# Patient Record
Sex: Male | Born: 1980 | Hispanic: Yes | Marital: Single
Health system: Southern US, Community
[De-identification: ages and names within clinical notes are randomized; demographics above are authoritative.]

---

## 2004-08-17 ENCOUNTER — Ambulatory Visit (HOSPITAL_BASED_OUTPATIENT_CLINIC_OR_DEPARTMENT_OTHER): Admission: RE | Admit: 2004-08-17 | Discharge: 2004-08-17 | Payer: Self-pay | Admitting: Orthopedic Surgery

## 2008-03-19 ENCOUNTER — Emergency Department (HOSPITAL_COMMUNITY): Admission: EM | Admit: 2008-03-19 | Discharge: 2008-03-19 | Payer: Self-pay | Admitting: Emergency Medicine

## 2008-04-03 ENCOUNTER — Emergency Department (HOSPITAL_COMMUNITY): Admission: EM | Admit: 2008-04-03 | Discharge: 2008-04-03 | Payer: Self-pay | Admitting: Emergency Medicine

## 2008-11-29 IMAGING — CR DG CHEST 2V
2 series · 2 of 2 positions shown · non-contrast
Comparison: None.

CLINICAL DATA: Cough, upper respiratory changes, fever]

CHEST - 2 VIEW

[w chest pa]
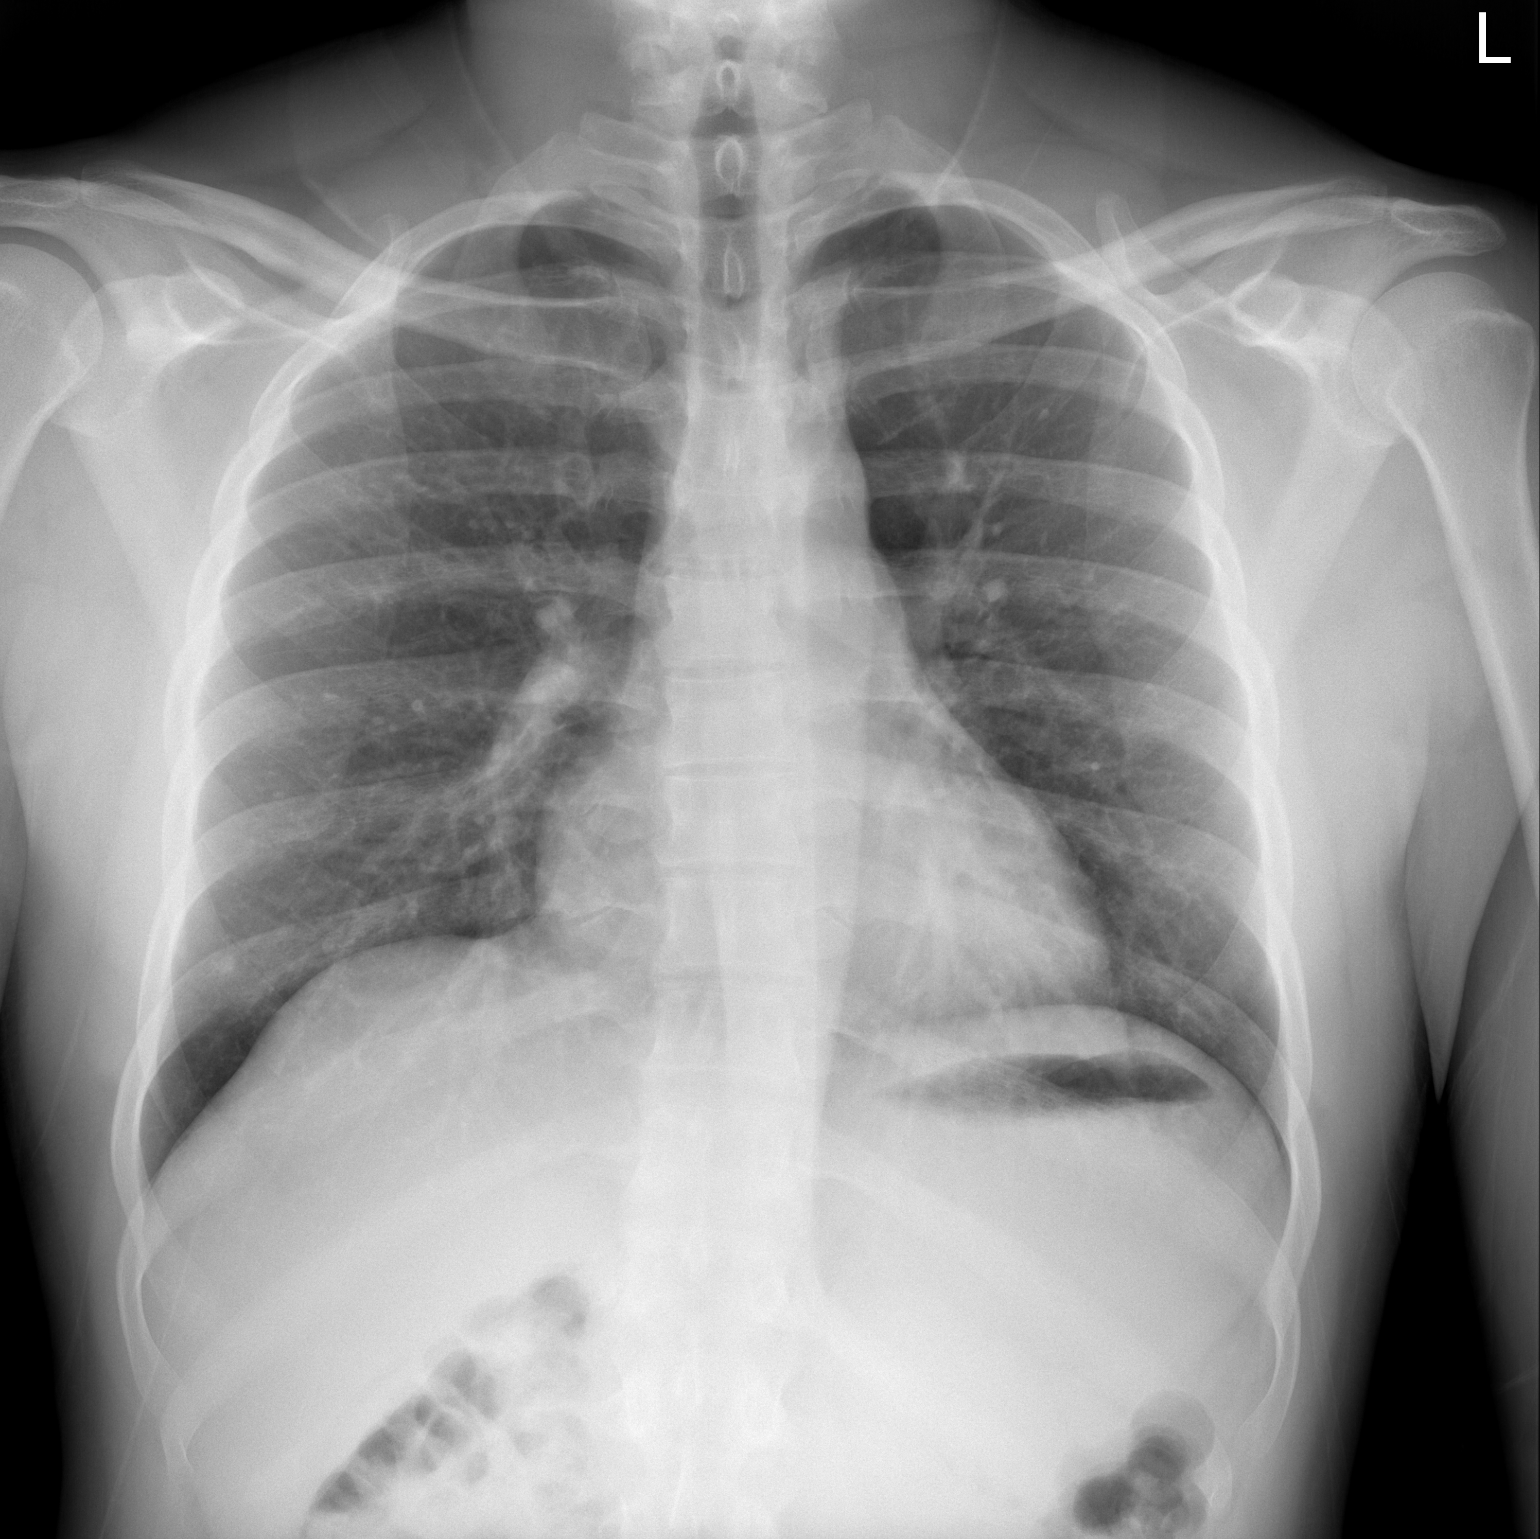

[w chest lat]
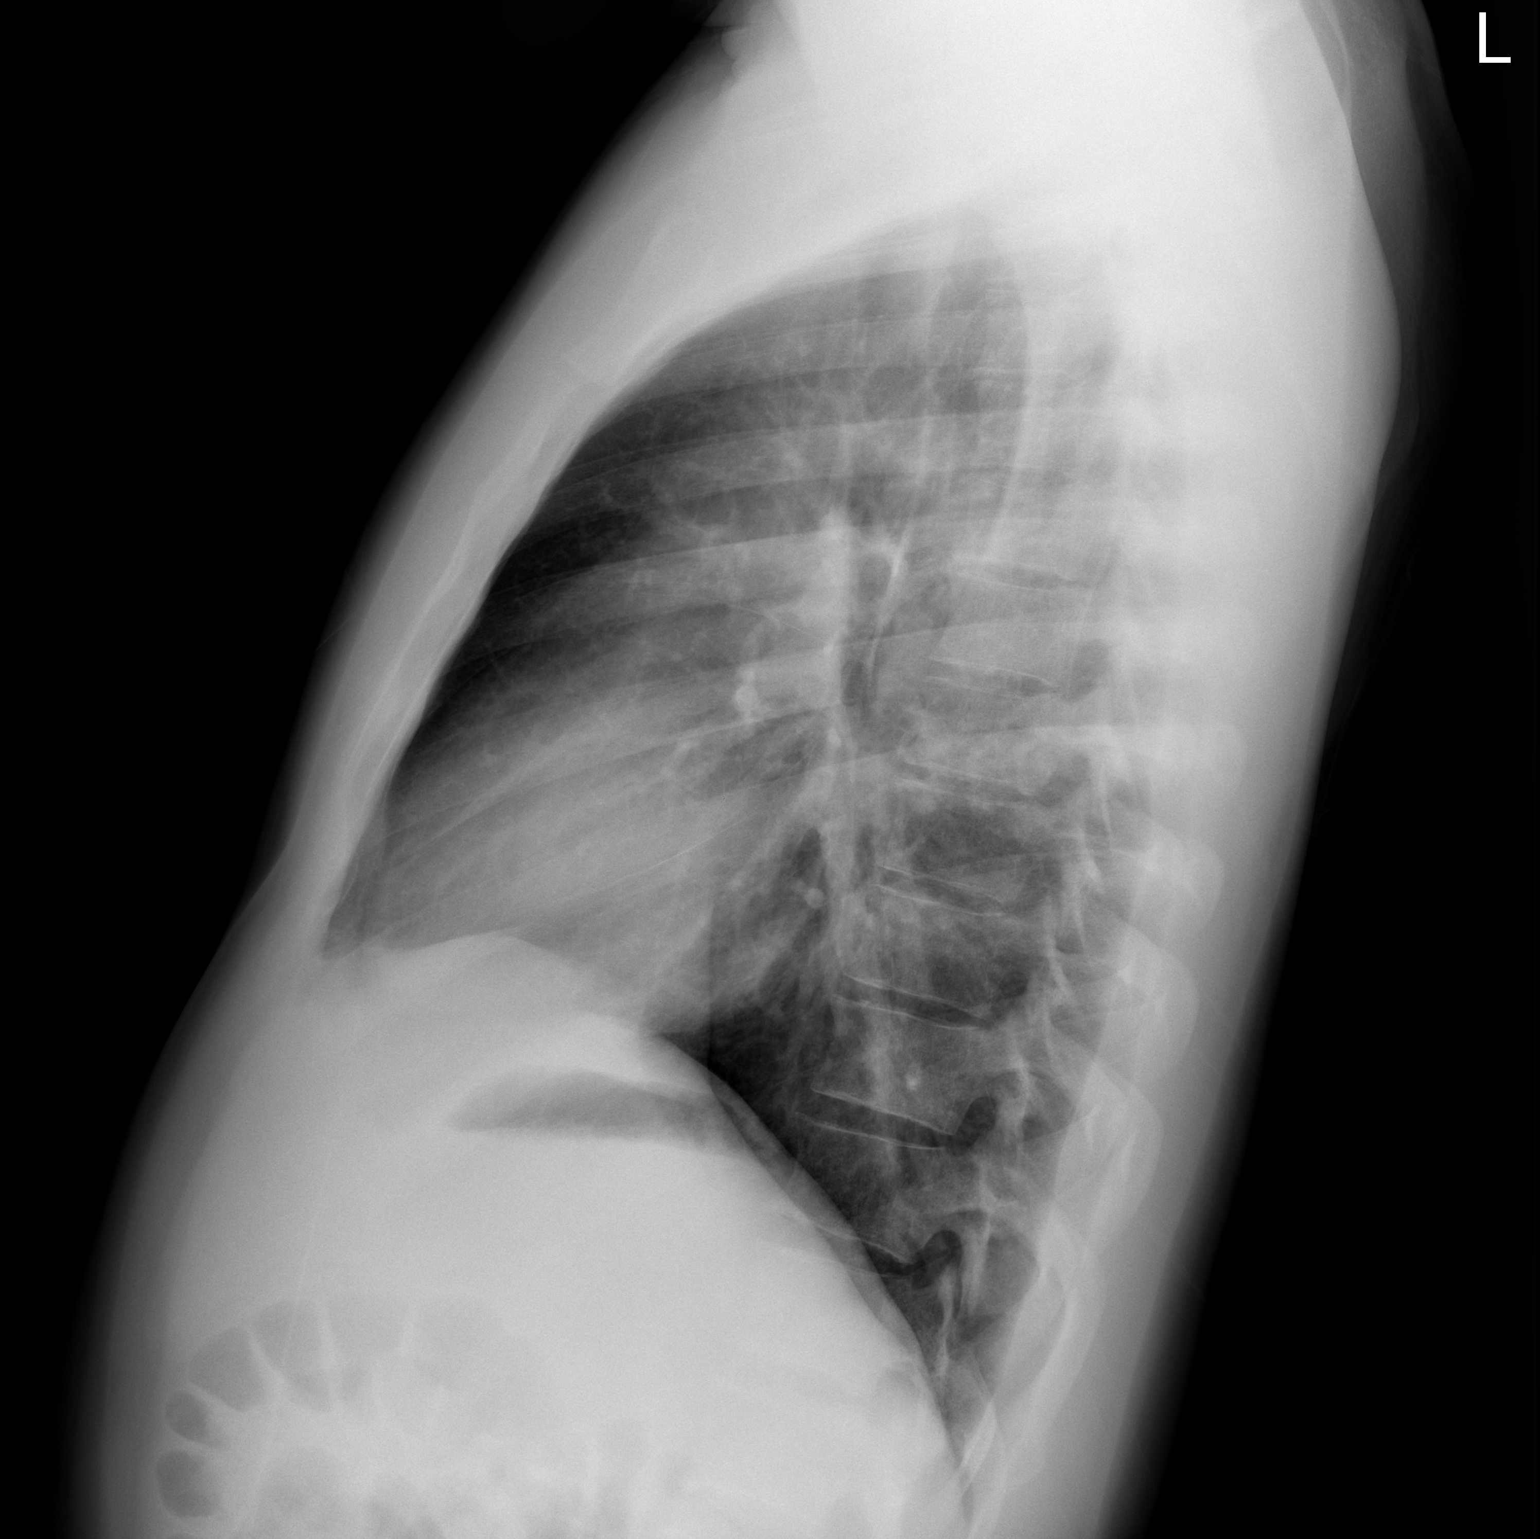

[2 of 2 positions shown; findings below may reference images not displayed]

FINDINGS: Normal heart size and vascularity.  No definite focal
pneumonia.  Bronchial thickening is noted centrally.  Bronchitis is
not excluded.  No large effusion or pneumothorax.  Trachea is
midline.  In the right lower chest, there is a well-circumscribed 6
mm radiodense nodule, suspicious for small granuloma.  Comparison
with any outside prior studies would be helpful.
IMPRESSION: Central bronchial thickening, bronchitis is not excluded.

Probable 6 mm right lower lobe granuloma.

## 2011-04-02 NOTE — Op Note (Signed)
NAME:  Daniel Hardy, Daniel Hardy              ACCOUNT NO.:  [id number]   MEDICAL RECORD NO.:  [id number]          PATIENT TYPE:  AMB   LOCATION:  DSC                          FACILITY:  [facility]   PHYSICIAN:  Feliberto Gottron. Turner Daniels, M.D.   DATE OF BIRTH:  [birth year]   DATE OF PROCEDURE:  08/17/2004  DATE OF DISCHARGE:                                 OPERATIVE REPORT   PREOPERATIVE DIAGNOSIS:  Medial and lateral meniscus tears with anterior  cruciate ligament tear.   POSTOPERATIVE DIAGNOSIS:  Medial and lateral meniscus tears with anterior  cruciate ligament tear.   PROCEDURE:  Right knee partial arthroscopic medial and lateral  meniscectomies with large posterior horn tears of both. Both were complex  with a bucket handle and horizontal cleavage component.  This was followed  by endoscopic allograft anterior cruciate ligament reconstruction using an  11 mm wide bone tendon bone allograft with an 8 x 20 femoral screw and an 8  x 20 tibial screw.   SURGEON:  Feliberto Gottron. Turner Daniels, M.D.   FIRST ASSISTANT:  Skip Mayer, PA-C.   ANESTHESIA:  General endotracheal anesthesia.   ESTIMATED BLOOD LOSS:  Minimal.   FLUIDS REPLACED:  One liter of crystalloid.   DRAINS:  None.   TOURNIQUET TIME:  None.   INDICATIONS FOR PROCEDURE:  The patient is a 30 year old man injured at work  and sustaining medial and lateral meniscus tears as well as an anterior  cruciate ligament tear of his right knee that he said was asymptomatic prior  to the injury. He is a 30 year old Hispanic gentleman and I had to used an  interpreter to discuss this with him.  Because of his young age and active  life, an ACL reconstruction with either repair of removal of the meniscal  tear was recommended.   DESCRIPTION OF PROCEDURE:  The patient was identified by the arm band, taken  to the operating room at [facility] and the appropriate  anesthetic monitors were attached and general endotracheal anesthesia was  induced with  the patient in the supine position. Lateral post and foot  positioner applied to the table, the tourniquet applied high to the right  thigh and never used, the right lower extremity was prepped and draped in  the usual sterile fashion from the ankle to the tourniquet. We began the  procedure by making inferomedial and inferolateral peripatellar portals with  a #11 blade allowing introduction of the arthroscope through the  inferolateral portal and the working instrument through the inferomedial  portal. The suprapatellar pouch, patella and trochlea were in good position  on the medial side.  The patient had complex tearing of the posterior and  medial horns of the medial meniscus which was debrided back to a stable  margin with a 4.2 great white sucker shaver as well as a 3.5 gator sucker  shaver.  On the lateral side, a double big tear was identified and removed  both with the biters, the 4.2 great white sucker shaver and the 3.5 gator  sucker shaver back to a smooth, stable margin.  Moving into the notch,  the  patient did have a complete ACL tear off the femoral origin. The stumps were  removed and a standard superior medial notch plasty performed with the 4.5  hooded vortex bur.  Using the tibial pin placement guide set at 55 degrees,  a standard guide pinning was done driven from about a cm and a half distal  to and medial to the tibial tubercle up to the posterior aspect of the ACL  footprint on the tibia using a 1.5 cm incision set by placement of the pin.  We then over reamed with an 11 mm reamer, set the knee at 45 degrees of  flexion and made our tibial tunnel with a 7 degree over the top guide, a  single placement pin and again an 11 mm badger type reamer to a depth of 35  mm.  In the femoral tunnel, we then placed a superior notch and in the  tibial tunnel and lateral notch and the wound was thoroughly irrigated out  with normal saline solution.  Meanwhile, at the back table, an  11 mm wide  bone tendon bone allograft was being prepared.  With a single wire through  the distal aspect of the graft which was then threaded through the tibial  tunnel into the femoral tunnel, the knee was then flexed to about 105  degrees and a supplemental intramedial portal allowing introduction of a  guide wire which went up between the tunnel and the bone plug and the little  notch we had prepared.  We then fixed the bone plug with an 8 x 20 [employer]  titanium propelled screw with a good squeak as the screw was driven.  The  knee was then taken through a range of motion with a good isometry set at 45  degrees.  With a reverse Lachman maneuver, the graft was twisted about 45  degrees and then the tibial bone plug fixed with another 8 x 20 titanium  propelled screw because of the patient's extremely hard bone.  Satisfied  with the fixation using the screws, the knee was taken through a range of  motion.  No graft impingement was noted and isometry was excellent and  Lachman test was negative.  It was 3 or 4+ preoperatively.  A little bit of  bone plug, about 3 mm, protruding from the tibial tunnel was removed taken  from 26 to a 20, 10 mm bone plug.  The 20 gauge wire used for traction on  the bone plug was then cut and the knee once again irrigated out with normal  saline solution.  The incision for placement of the graft was then closed  with 4-0 Monocryl suture as was the inferomedial supplemental portal.  The  two scope portals were left open.  A dressing of Xeroform, 4 x 4 dressing  sponges, Webril and an ace wrap applied.  The patient was then awakened and  taken to the recovery room without difficulty.       FJR/MEDQ  D:  08/17/2004  T:  08/17/2004  Job:  [id number]

## 2018-03-14 ENCOUNTER — Other Ambulatory Visit: Payer: Self-pay

## 2018-03-14 ENCOUNTER — Emergency Department (HOSPITAL_COMMUNITY)
Admission: EM | Admit: 2018-03-14 | Discharge: 2018-03-14 | Disposition: A | Discharge status: Home / Self Care | Payer: Self-pay | Attending: Emergency Medicine | Admitting: Emergency Medicine

## 2018-03-14 ENCOUNTER — Encounter (HOSPITAL_COMMUNITY): Payer: Self-pay

## 2018-03-14 DIAGNOSIS — G5603 Carpal tunnel syndrome, bilateral upper limbs: Secondary | ICD-10-CM | POA: Insufficient documentation

## 2018-03-14 MED ORDER — PREDNISONE 10 MG (21) PO TBPK: ORAL_TABLET | Freq: Every day | ORAL | 0 refills | Status: AC

## 2018-03-14 MED ORDER — DICLOFENAC SODIUM 1 % TD GEL: 2.0000 g | Freq: Four times a day (QID) | TRANSDERMAL | 0 refills | Status: AC

## 2018-03-14 NOTE — ED Triage Notes (Signed)
Pt presents for evaluation of R arm pain. Reports has been going on for "awhile" but worsened yesterday. Pt reports difficulty sleeping due to pain. Pain worse in R elbow and first finger. 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

## 2018-03-14 NOTE — ED Provider Notes (Signed)
[facility] EMERGENCY DEPARTMENT Provider Note   CSN: [id number] Arrival date & time: 03/14/18  1036     History   Chief Complaint Chief Complaint  Patient presents with  . Arm Pain    HPI Daniel Hardy is a 37 y.o. male presenting for evaluation of arm pain.  Patient states that intermittently for the past several years, he has been having bilateral arm pain.  His arm pain worsened in his right hand recently.  Pain begins in his elbow was goes to his first 3 fingers.  He describes an associated tingling.  He denies recent fall, trauma, or injury.  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  He has not taken anything for pain including Tylenol or ibuprofen.  He has no other medical problems but does not take medications daily.  He denies dropping things with his hands.  He denies pain with movement of his elbow or shoulder.  No neck or back pain.  Pain is sharp and severe today, and has been constant since yesterday.  HPI  History reviewed. No pertinent past medical history.  There are no active problems to display for this patient.   History reviewed. No pertinent surgical history.      Home Medications    Prior to Admission medications   Medication Sig Start Date End Date Taking? Authorizing Provider  ibuprofen (ADVIL,MOTRIN) 200 MG tablet Take 400 mg by mouth every 6 (six) hours as needed for headache or mild pain.   Yes [provider]  diclofenac sodium (VOLTAREN) 1 % GEL Apply 2 g topically 4 (four) times daily. 03/14/18   Yoshimi Sarr, PA-C  predniSONE (STERAPRED UNI-PAK 21 TAB) 10 MG (21) TBPK tablet Take by mouth daily. Take 6 tabs by mouth daily  for 2 days, then 5 tabs for 2 days, then 4 tabs for 2 days, then 3 tabs for 2 days, 2 tabs for 2 days, then 1 tab by mouth daily for 2 days 03/14/18   Alida Greiner, PA-C    Family History No family history on file.  Social History Social History   Tobacco Use    . Smoking status: Not on file  Substance Use Topics  . Alcohol use: Not on file  . Drug use: Not on file     Allergies   Patient has no known allergies.   Review of Systems Review of Systems  Musculoskeletal: Positive for arthralgias and myalgias.  Neurological: Negative for numbness.  Hematological: Does not bruise/bleed easily.     Physical Exam Updated Vital Signs BP (!) 129/92 (BP Location: Right Arm)   Pulse (!) 58   Temp 97.7 F (36.5 C) (Oral)   Resp 14   SpO2 100%   Physical Exam  Constitutional: He is oriented to person, place, and time. He appears well-developed and well-nourished. No distress.  HENT:  Head: Normocephalic and atraumatic.  Eyes: EOM are normal.  Neck: Normal range of motion.  Cardiovascular: Normal rate, regular rhythm and intact distal pulses.  Pulmonary/Chest: Effort normal and breath sounds normal. No respiratory distress. He has no wheezes.  Abdominal: He exhibits no distension.  Musculoskeletal: Normal range of motion. He exhibits tenderness.  No obvious deformity of bilateral forearms.  No tenderness to palpation of the forearm.  Tenderness to palpation of right radial wrist.  Positive Tinel's bilaterally.  No redness, warmth, or swelling.  Grip strength intact bilaterally.  Radial pulses equal bilaterally.  Full active range of motion  of elbow, shoulder, wrist, and fingers.  No obvious sensory deficit.  Neurological: He is alert and oriented to person, place, and time. No sensory deficit.  Skin: Skin is warm. No rash noted.  Psychiatric: He has a normal mood and affect.  Nursing note and vitals reviewed.    ED Treatments / Results  Labs (all labs ordered are listed, but only abnormal results are displayed) Labs Reviewed - No data to display  EKG None  Radiology No results found.  Procedures Procedures (including critical care time)  Medications Ordered in ED Medications - No data to display   Initial Impression /  Assessment and Plan / ED Course  I have reviewed the triage vital signs and the nursing notes.  Pertinent labs & imaging results that were available during my care of the patient were reviewed by me and considered in my medical decision making (see chart for details).     Patient presenting for evaluation of bilateral arm pain, worse on the right.  Physical exam shows patient is neurovascularly intact.  Consistent with carpal tunnel syndrome.  Doubt septic joint, fx, or dislocation. Pt works with his hands.  I do not believe x-ray or imaging would be beneficial at this time.  Discussed findings with patient.  Discussed course of prednisone, response, and use of Voltaren gel.  Follow-up with hand as needed.  At this time, patient be safe for discharge.  Return precautions given.  Patient states he understands and agrees to plan.  Final Clinical Impressions(s) / ED Diagnoses   Final diagnoses:  Bilateral carpal tunnel syndrome    ED Discharge Orders        Ordered    predniSONE (STERAPRED UNI-PAK 21 TAB) 10 MG (21) TBPK tablet  Daily     03/14/18 1240    diclofenac sodium (VOLTAREN) 1 % GEL  4 times daily     03/14/18 1240       Danyla Wattley, PA-C 03/14/18 1528    Derwood Kaplan, MD 03/14/18 1628

## 2018-03-14 NOTE — Discharge Instructions (Signed)
Take prednisone as prescribed.  Take the entire course even if your symptoms improve. Use wrist splints at night, and is able to during the day. Use Voltaren gel as needed for continued pain. Use ice as needed for pain and swelling. Follow-up with hand doctor for further evaluation and management of your pain. Return to the emergency room if you develop new or worsening symptoms.

## 2020-02-15 ENCOUNTER — Ambulatory Visit: Payer: Self-pay | Attending: Internal Medicine

## 2020-02-15 DIAGNOSIS — Z23 Encounter for immunization: Secondary | ICD-10-CM

## 2020-02-15 NOTE — Progress Notes (Signed)
   Covid-19 Vaccination Clinic  Name:  Daniel Hardy    MRN: [id number] DOB: 01-08-1981  02/15/2020  [name] was observed post Covid-19 immunization for 15 minutes without incident. He was provided with Vaccine Information Sheet and instruction to access the V-Safe system.   [name] was instructed to call 911 with any severe reactions post vaccine: Marland Kitchen Difficulty breathing  . Swelling of face and throat  . A fast heartbeat  . A bad rash all over body  . Dizziness and weakness   Immunizations Administered    Name Date Dose VIS Date Route   Pfizer COVID-19 Vaccine 02/15/2020  2:22 PM 0.3 mL 10/26/2019 Intramuscular   Manufacturer: [employer], [employer]   Lot: [id number]   NDC: [address]-7

## 2020-03-10 ENCOUNTER — Ambulatory Visit: Payer: Self-pay | Attending: Internal Medicine
